# Patient Record
Sex: Female | Born: 1985 | Race: White | Hispanic: No | Marital: Single | Smoking: Never smoker
Health system: Southern US, Community
[De-identification: ages and names within clinical notes are randomized; demographics above are authoritative.]

## PROBLEM LIST (undated history)

## (undated) DIAGNOSIS — N289 Disorder of kidney and ureter, unspecified: Secondary | ICD-10-CM

---

## 2019-01-23 ENCOUNTER — Emergency Department (HOSPITAL_COMMUNITY)
Admission: EM | Admit: 2019-01-23 | Discharge: 2019-01-23 | Disposition: A | Discharge status: Home / Self Care | Attending: Emergency Medicine | Admitting: Emergency Medicine

## 2019-01-23 ENCOUNTER — Other Ambulatory Visit: Payer: Self-pay

## 2019-01-23 ENCOUNTER — Encounter (HOSPITAL_COMMUNITY): Payer: Self-pay | Admitting: *Deleted

## 2019-01-23 DIAGNOSIS — M549 Dorsalgia, unspecified: Secondary | ICD-10-CM | POA: Diagnosis not present

## 2019-01-23 DIAGNOSIS — R1031 Right lower quadrant pain: Secondary | ICD-10-CM | POA: Insufficient documentation

## 2019-01-23 DIAGNOSIS — Z5321 Procedure and treatment not carried out due to patient leaving prior to being seen by health care provider: Secondary | ICD-10-CM | POA: Insufficient documentation

## 2019-01-23 LAB — URINALYSIS, ROUTINE W REFLEX MICROSCOPIC
Bilirubin Urine: NEGATIVE
Glucose, UA: NEGATIVE mg/dL
Ketones, ur: NEGATIVE mg/dL
Leukocytes,Ua: NEGATIVE
Nitrite: NEGATIVE
Protein, ur: NEGATIVE mg/dL
Specific Gravity, Urine: 1.021 (ref 1.005–1.030)
pH: 5 (ref 5.0–8.0)

## 2019-01-23 LAB — CBC
HCT: 41.2 % (ref 36.0–46.0)
Hemoglobin: 13.5 g/dL (ref 12.0–15.0)
MCH: 29.9 pg (ref 26.0–34.0)
MCHC: 32.8 g/dL (ref 30.0–36.0)
MCV: 91.4 fL (ref 80.0–100.0)
Platelets: 249 10*3/uL (ref 150–400)
RBC: 4.51 MIL/uL (ref 3.87–5.11)
RDW: 12 % (ref 11.5–15.5)
WBC: 11.5 10*3/uL — ABNORMAL HIGH (ref 4.0–10.5)
nRBC: 0 % (ref 0.0–0.2)

## 2019-01-23 LAB — COMPREHENSIVE METABOLIC PANEL
ALT: 10 U/L (ref 0–44)
AST: 13 U/L — ABNORMAL LOW (ref 15–41)
Albumin: 3.8 g/dL (ref 3.5–5.0)
Alkaline Phosphatase: 64 U/L (ref 38–126)
Anion gap: 11 (ref 5–15)
BUN: 9 mg/dL (ref 6–20)
CO2: 23 mmol/L (ref 22–32)
Calcium: 9 mg/dL (ref 8.9–10.3)
Chloride: 106 mmol/L (ref 98–111)
Creatinine, Ser: 0.75 mg/dL (ref 0.44–1.00)
GFR calc Af Amer: >60 mL/min (ref >60)
GFR calc non Af Amer: >60 mL/min (ref >60)
Glucose, Bld: 103 mg/dL — ABNORMAL HIGH (ref 70–99)
Potassium: 3.9 mmol/L (ref 3.5–5.1)
Sodium: 140 mmol/L (ref 135–145)
Total Bilirubin: 0.4 mg/dL (ref 0.3–1.2)
Total Protein: 6.4 g/dL — ABNORMAL LOW (ref 6.5–8.1)

## 2019-01-23 LAB — I-STAT BETA HCG BLOOD, ED (MC, WL, AP ONLY): I-stat hCG, quantitative: <5 m[IU]/mL (ref <5)

## 2019-01-23 LAB — LIPASE, BLOOD: Lipase: 31 U/L (ref 11–51)

## 2019-01-23 MED ORDER — SODIUM CHLORIDE 0.9% FLUSH: 3.0000 mL | Freq: Once | INTRAVENOUS | Status: DC

## 2019-01-23 NOTE — ED Triage Notes (Signed)
Pt reports waking up this am with back pain and RLQ pain. RLQ was severe and decreased after vomiting. No problems urinating this am, treated one week ago for UTI.

## 2019-01-23 NOTE — ED Notes (Signed)
Pt leaving AMA. Pt stated she is going to look at [facility] for results. Pt advised to return if symptoms worsen.

## 2019-01-24 ENCOUNTER — Emergency Department (HOSPITAL_COMMUNITY)

## 2019-01-24 ENCOUNTER — Encounter (HOSPITAL_COMMUNITY): Payer: Self-pay

## 2019-01-24 ENCOUNTER — Other Ambulatory Visit: Payer: Self-pay

## 2019-01-24 ENCOUNTER — Emergency Department (HOSPITAL_COMMUNITY)
Admission: EM | Admit: 2019-01-24 | Discharge: 2019-01-24 | Disposition: A | Discharge status: Home / Self Care | Attending: Emergency Medicine | Admitting: Emergency Medicine

## 2019-01-24 DIAGNOSIS — Z79899 Other long term (current) drug therapy: Secondary | ICD-10-CM | POA: Diagnosis not present

## 2019-01-24 DIAGNOSIS — R1031 Right lower quadrant pain: Secondary | ICD-10-CM | POA: Diagnosis present

## 2019-01-24 DIAGNOSIS — N132 Hydronephrosis with renal and ureteral calculous obstruction: Secondary | ICD-10-CM | POA: Diagnosis not present

## 2019-01-24 HISTORY — DX: Disorder of kidney and ureter, unspecified: N28.9

## 2019-01-24 LAB — COMPREHENSIVE METABOLIC PANEL
ALT: 12 U/L (ref 0–44)
AST: 15 U/L (ref 15–41)
Albumin: 4.1 g/dL (ref 3.5–5.0)
Alkaline Phosphatase: 63 U/L (ref 38–126)
Anion gap: 11 (ref 5–15)
BUN: 12 mg/dL (ref 6–20)
CO2: 22 mmol/L (ref 22–32)
Calcium: 9.2 mg/dL (ref 8.9–10.3)
Chloride: 107 mmol/L (ref 98–111)
Creatinine, Ser: 0.88 mg/dL (ref 0.44–1.00)
GFR calc Af Amer: >60 mL/min (ref >60)
GFR calc non Af Amer: >60 mL/min (ref >60)
Glucose, Bld: 123 mg/dL — ABNORMAL HIGH (ref 70–99)
Potassium: 4.2 mmol/L (ref 3.5–5.1)
Sodium: 140 mmol/L (ref 135–145)
Total Bilirubin: 0.6 mg/dL (ref 0.3–1.2)
Total Protein: 7 g/dL (ref 6.5–8.1)

## 2019-01-24 LAB — CBC WITH DIFFERENTIAL/PLATELET
Abs Immature Granulocytes: 0.06 10*3/uL (ref 0.00–0.07)
Basophils Absolute: 0.1 10*3/uL (ref 0.0–0.1)
Basophils Relative: 0 %
Eosinophils Absolute: 0 10*3/uL (ref 0.0–0.5)
Eosinophils Relative: 0 %
HCT: 39.4 % (ref 36.0–46.0)
Hemoglobin: 13.1 g/dL (ref 12.0–15.0)
Immature Granulocytes: 0 %
Lymphocytes Relative: 4 %
Lymphs Abs: 0.7 10*3/uL (ref 0.7–4.0)
MCH: 30.1 pg (ref 26.0–34.0)
MCHC: 33.2 g/dL (ref 30.0–36.0)
MCV: 90.6 fL (ref 80.0–100.0)
Monocytes Absolute: 0.5 10*3/uL (ref 0.1–1.0)
Monocytes Relative: 3 %
Neutro Abs: 14.6 10*3/uL — ABNORMAL HIGH (ref 1.7–7.7)
Neutrophils Relative %: 93 %
Platelets: 215 10*3/uL (ref 150–400)
RBC: 4.35 MIL/uL (ref 3.87–5.11)
RDW: 12 % (ref 11.5–15.5)
WBC: 15.9 10*3/uL — ABNORMAL HIGH (ref 4.0–10.5)
nRBC: 0 % (ref 0.0–0.2)

## 2019-01-24 LAB — URINALYSIS, ROUTINE W REFLEX MICROSCOPIC
Bilirubin Urine: NEGATIVE
Glucose, UA: NEGATIVE mg/dL
Ketones, ur: 20 mg/dL — AB
Leukocytes,Ua: NEGATIVE
Nitrite: NEGATIVE
Protein, ur: 30 mg/dL — AB
Specific Gravity, Urine: 1.01 (ref 1.005–1.030)
pH: 6 (ref 5.0–8.0)

## 2019-01-24 LAB — PREGNANCY, URINE: Preg Test, Ur: NEGATIVE

## 2019-01-24 MED ORDER — ONDANSETRON 4 MG PO TBDP: 4.0000 mg | ORAL_TABLET | Freq: Three times a day (TID) | ORAL | 0 refills | Status: AC | PRN

## 2019-01-24 MED ORDER — ONDANSETRON 4 MG PO TBDP: 4.0000 mg | ORAL_TABLET | Freq: Three times a day (TID) | ORAL | 0 refills | Status: DC | PRN

## 2019-01-24 MED ORDER — NAPROXEN 500 MG PO TABS: 500.0000 mg | ORAL_TABLET | Freq: Two times a day (BID) | ORAL | 0 refills | Status: DC

## 2019-01-24 MED ORDER — NAPROXEN 500 MG PO TABS: 500.0000 mg | ORAL_TABLET | Freq: Two times a day (BID) | ORAL | 0 refills | Status: AC

## 2019-01-24 MED ORDER — SODIUM CHLORIDE 0.9 % IV BOLUS
500.0000 mL | Freq: Once | INTRAVENOUS | Status: AC
  Administered 2019-01-24: 500 mL via INTRAVENOUS

## 2019-01-24 NOTE — ED Provider Notes (Signed)
[facility]-EMERGENCY DEPT Provider Note   CSN: [id number] Arrival date & time: 01/24/19  0741     History Chief Complaint  Patient presents with  . Abdominal Pain  . Emesis    Paula Jackson is a 34 y.o. female with no significant PMH presents to the ED via EMS for 10 out of 10 acute onset right-sided flank and RLQ pain with associated nausea and vomiting.  Patient reports that she was treated for a UTI at a [facility] 01/10/2019 and her symptoms have entirely resolved before she began having this right-sided flank and RLQ abdominal pain that suddenly began 2 days ago.  She presented to the ED yesterday and obtained testing which demonstrated hematuria, but due to the excessive wait time she LWBS.  Her pain had reportedly subsided and she was feeling better, but then returned with a vengeance this morning.  She also endorses increased urinary frequency and urgency, but denies any dysuria and states that this feels much different from her recent UTI.  She also endorses diminished appetite and frequent nausea and vomiting, now dry heaving.  She denies any fevers or chills, recent illness, hematemesis, gross hematuria, chest pain or shortness of breath, vaginal discharge, vaginal pain, headache or dizziness, or other symptoms.  She received fentanyl and Zofran in route via EMS and currently her pain is relatively well controlled.  Patient lives approximately 2 hours away, but is in town visiting her mother.  Her mother or her brother will be available to pick her up after today's encounter.   HPI     Past Medical History:  Diagnosis Date  . Renal disorder     There are no problems to display for this patient.   History reviewed. No pertinent surgical history.   OB History   No obstetric history on file.     Family History  Problem Relation Age of Onset  . Thyroid disease Mother     Social History   Tobacco Use  . Smoking status: Never Smoker    . Smokeless tobacco: Never Used  Substance Use Topics  . Alcohol use: Never  . Drug use: Never    Home Medications Prior to Admission medications   Medication Sig Start Date End Date Taking? Authorizing Provider  doxycycline (VIBRAMYCIN) 100 MG capsule Take 100 mg by mouth daily. 11/30/17  Yes [provider]  naproxen (NAPROSYN) 500 MG tablet Take 1 tablet (500 mg total) by mouth 2 (two) times daily. 01/24/19   Lorelee New, PA-C  ondansetron (ZOFRAN ODT) 4 MG disintegrating tablet Take 1 tablet (4 mg total) by mouth every 8 (eight) hours as needed for nausea or vomiting. 01/24/19   Lorelee New, PA-C    Allergies    Patient has no known allergies.  Review of Systems   Review of Systems  All other systems reviewed and are negative.   Physical Exam Updated Vital Signs BP 116/69   Pulse 76   Temp 97.6 F (36.4 C) (Oral)   Resp 16   Ht 5\' 2"  (1.575 m)   Wt 70.3 kg   LMP 12/29/2018   SpO2 98%   BMI 28.35 kg/m   Physical Exam Vitals and nursing note reviewed. Exam conducted with a chaperone present.  Constitutional:      Comments: Uncomfortable.  HENT:     Head: Normocephalic and atraumatic.  Eyes:     General: No scleral icterus.    Conjunctiva/sclera: Conjunctivae normal.  Cardiovascular:  Rate and Rhythm: Normal rate and regular rhythm.     Pulses: Normal pulses.     Heart sounds: Normal heart sounds.  Pulmonary:     Effort: Pulmonary effort is normal. No respiratory distress.     Breath sounds: Normal breath sounds. No wheezing or rales.  Abdominal:     Comments: Soft, nondistended.  TTP in RLQ, no TTP elsewhere.  No overlying skin changes.  No guarding.  No significant right CVA tenderness.  Negative Rovsing and psoas sign  Musculoskeletal:     Cervical back: Normal range of motion and neck supple. No rigidity.  Skin:    General: Skin is dry.  Neurological:     Mental Status: She is alert.     GCS: GCS eye subscore is 4. GCS verbal  subscore is 5. GCS motor subscore is 6.  Psychiatric:        Mood and Affect: Mood normal.        Behavior: Behavior normal.        Thought Content: Thought content normal.     ED Results / Procedures / Treatments   Labs (all labs ordered are listed, but only abnormal results are displayed) Labs Reviewed  URINALYSIS, ROUTINE W REFLEX MICROSCOPIC - Abnormal; Notable for the following components:      Result Value   Hgb urine dipstick LARGE (*)    Ketones, ur 20 (*)    Protein, ur 30 (*)    Bacteria, UA RARE (*)    All other components within normal limits  CBC WITH DIFFERENTIAL/PLATELET - Abnormal; Notable for the following components:   WBC 15.9 (*)    Neutro Abs 14.6 (*)    All other components within normal limits  COMPREHENSIVE METABOLIC PANEL - Abnormal; Notable for the following components:   Glucose, Bld 123 (*)    All other components within normal limits  PREGNANCY, URINE    EKG None  Radiology CT Renal Stone Study  Result Date: 01/24/2019 CLINICAL DATA:  Hematuria, flank pain. EXAM: CT ABDOMEN AND PELVIS WITHOUT CONTRAST TECHNIQUE: Multidetector CT imaging of the abdomen and pelvis was performed following the standard protocol without IV contrast. COMPARISON:  None. FINDINGS: Lower chest: Lung bases are clear. Heart size normal. No pericardial or pleural effusion. Hepatobiliary: Liver and gallbladder are unremarkable. No biliary ductal dilatation. Pancreas: Negative. Spleen: Negative. Adrenals/Urinary Tract: Adrenal glands are unremarkable. There is mild right renal edema and mild right hydronephrosis. No obstructing urinary calculi. Left kidney is unremarkable. Left ureter is decompressed. Bladder is low in volume. Stomach/Bowel: Stomach, small bowel, appendix and colon are unremarkable. Vascular/Lymphatic: Vascular structures are unremarkable. No pathologically enlarged lymph nodes. Reproductive: Uterus is visualized. No adnexal mass. Menstrual cup is seen in the vaginal  canal. Other: No free fluid.  Mesenteries and peritoneum are unremarkable. Musculoskeletal: Negative. IMPRESSION: 1. Right renal edema and mild right hydronephrosis. No urinary calculi. Findings can be seen with recent passage of a stone. Concomitant pyelonephritis is not excluded. 2. No evidence of appendicitis. Electronically Signed   By: Lorin Picket M.D.   On: 01/24/2019 13:17    Procedures Procedures (including critical care time)  Medications Ordered in ED Medications  sodium chloride 0.9 % bolus 500 mL (500 mLs Intravenous Bolus from Bag 01/24/19 1127)    ED Course  I have reviewed the triage vital signs and the nursing notes.  Pertinent labs & imaging results that were available during my care of the patient were reviewed by me and considered in my  medical decision making (see chart for details).    MDM Rules/Calculators/A&P                      Patient describes her sudden onset of right-sided pain is beginning in her right flank and radiating into her RLQ with associated diminished appetite, nausea, and vomiting.  She reports that her UTI symptoms had resolved, but she no longer endorses dysuria, malodorous urine, or fevers and I have lower suspicion for pyelonephritis at this time.  She reports that culture was obtained which was positive for E. coli, susceptible to her prescribed nitrofurantoin.  Given description of her pain, lower suspicion for ovarian torsion at this time.  Instead, her history is more suspicious for obstructing ureterolithiasis versus appendicitis.   No prior history of abdominal surgeries.   Will obtain basic labs as well as CT renal stone study.   CBC with differential demonstrates leukocytosis to 15.9 with left shift, elevated from 11.5 yesterday.  CMP demonstrates no electrolyte abnormalities or impaired renal function.  CT renal stone study demonstrates a right renal edema and mild right hydronephrosis possibly suggestive of recent passage of a stone.  No  urinary calculi visualized on imaging.  No evidence to suggest appendicitis.  While imaging cannot exclude concomitant pyelonephritis.  Repeat physical exam again demonstrated no CVA tenderness.  Patient is in no acute distress and has not been complaining of any pain or nausea symptoms since she arrived to the room.  UA demonstrates no evidence of infection.  Will discharge patient with short course of Zofran, naproxen, as well as strict return precautions for any new or worsening symptoms.   Final Clinical Impression(s) / ED Diagnoses Final diagnoses:  Hydronephrosis concurrent with and due to calculi of kidney and ureter    Rx / DC Orders ED Discharge Orders         Ordered    ondansetron (ZOFRAN ODT) 4 MG disintegrating tablet  Every 8 hours PRN,   Status:  Discontinued     01/24/19 1346    naproxen (NAPROSYN) 500 MG tablet  2 times daily,   Status:  Discontinued     01/24/19 1346    naproxen (NAPROSYN) 500 MG tablet  2 times daily     01/24/19 1346    ondansetron (ZOFRAN ODT) 4 MG disintegrating tablet  Every 8 hours PRN     01/24/19 1346           Elvera Maria 01/24/19 1347    Raeford Razor, MD 01/24/19 1445

## 2019-01-24 NOTE — Discharge Instructions (Addendum)
CT imaging obtained today in conjunction with reported history is consistent with an obstructing kidney stone.  Fortunately, it appears to have passed and should no longer be an issue.  Please read the attachments on kidney stones and suggested dietary modifications.  I prescribed you a short course of Zofran and naproxen should you develop any residual spasms or discomfort from your persistent mild right-sided hydronephrosis.  Again, no need to provide a strainer as the suspected stone appears to have passed.  Please return to the ED or seek medical attention for any new or worsening symptoms.

## 2019-01-24 NOTE — ED Triage Notes (Signed)
Per EMS- Patient reports that she was seen yesterday and was diagnosed with kidney stones and hematuria. Patient having pain again today. Patient rated pain 9/10. EMS gave Fentanyl 150 mcg and Zofran 4mg  IV prior to arrival to the ED.

## 2020-12-26 IMAGING — CT CT RENAL STONE PROTOCOL
2 of 4 series · 17 of 46 positions shown, 19 images · non-contrast
Comparison: None.

CLINICAL DATA: Hematuria, flank pain.

EXAM:
CT ABDOMEN AND PELVIS WITHOUT CONTRAST
TECHNIQUE: Multidetector CT imaging of the abdomen and pelvis was performed
following the standard protocol without IV contrast.

[Series 3: coronal · coronal · 0.67mm/px · 3 of 136 slices shown]
[im 46/136  soft-tissue]
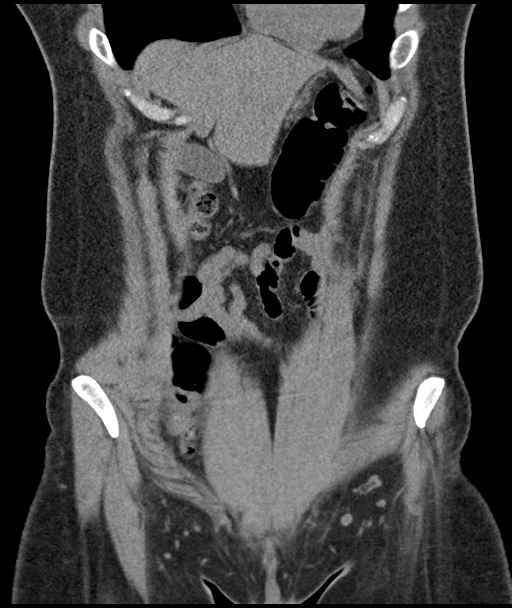
[im 61/136  soft-tissue]
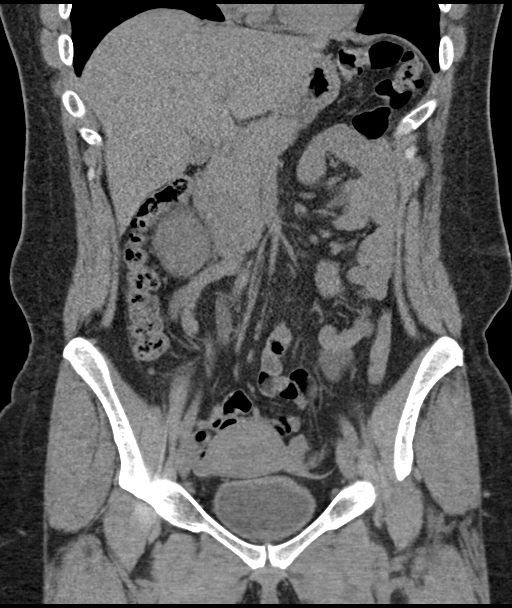
[im 76/136  soft-tissue]
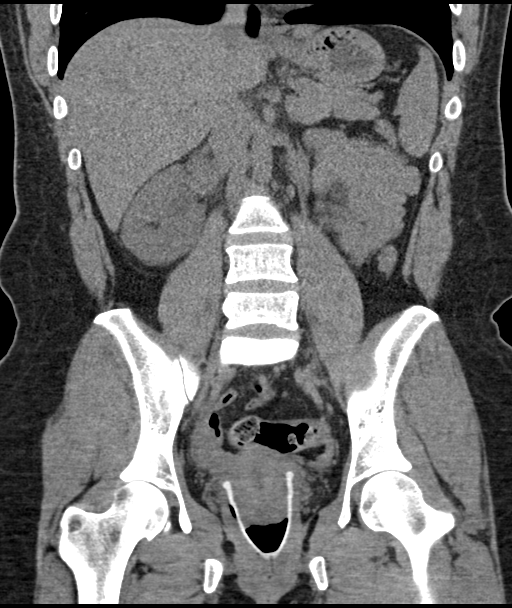

[Series 6: axial st · axial · 0.79mm/px · z∈[-381,-31]mm · 14 of 80 slices shown, 16 images]
[im 5/80  soft-tissue]
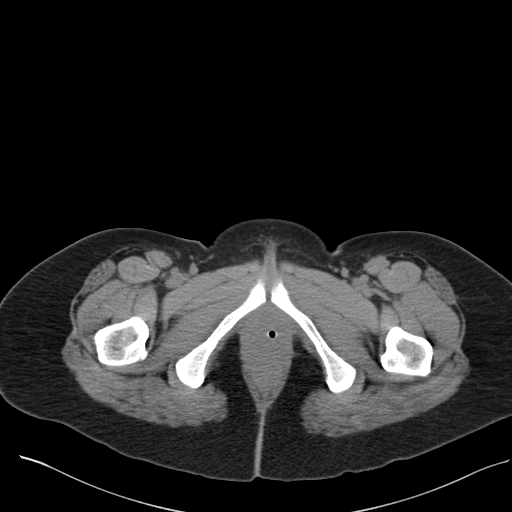
[im 5/80  bone]
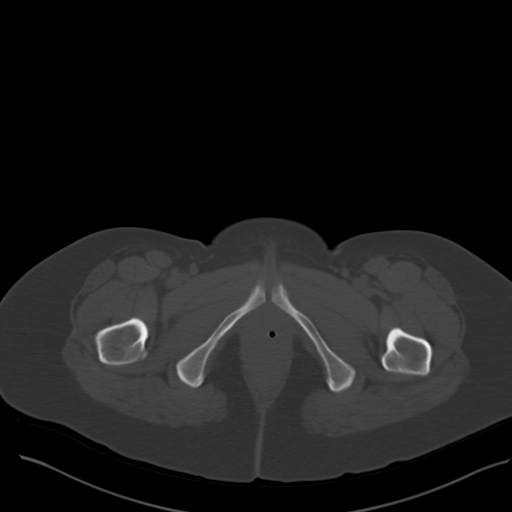
[im 9/80  soft-tissue]
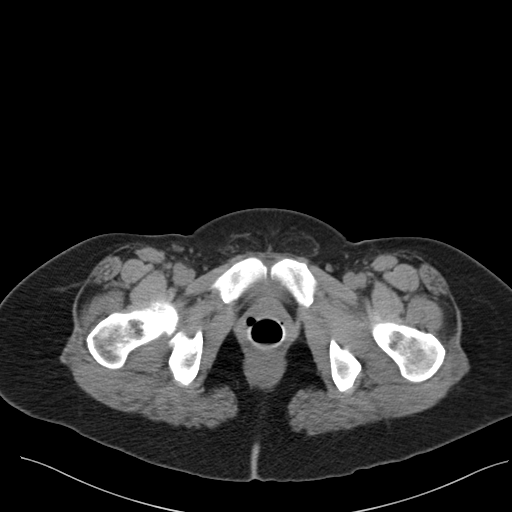
[im 17/80  soft-tissue]
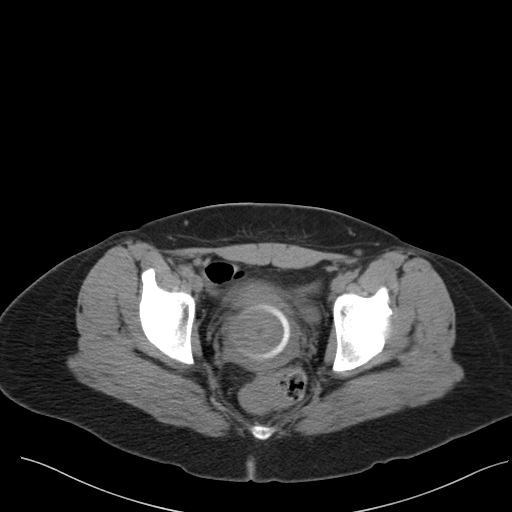
[im 21/80  soft-tissue]
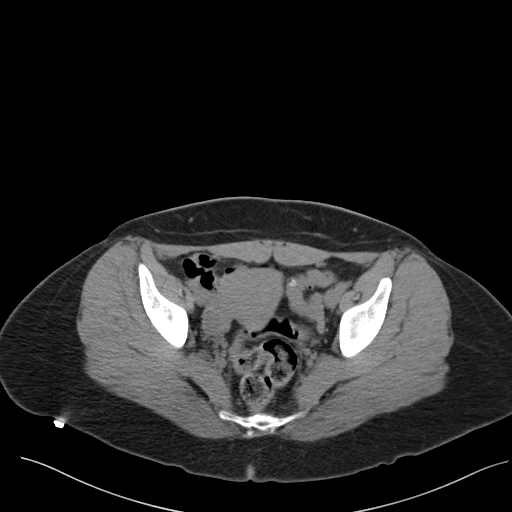
[im 25/80  soft-tissue]
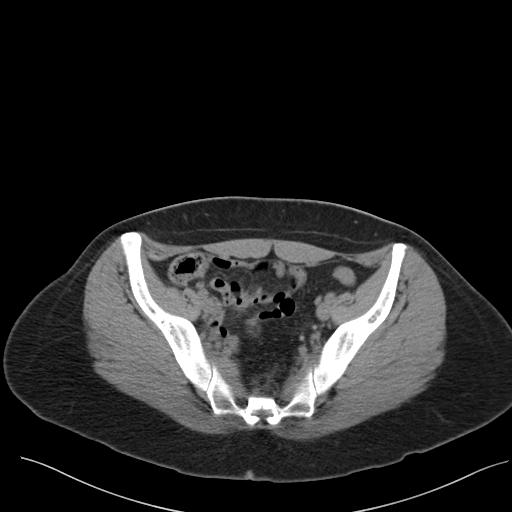
[im 34/80  soft-tissue]
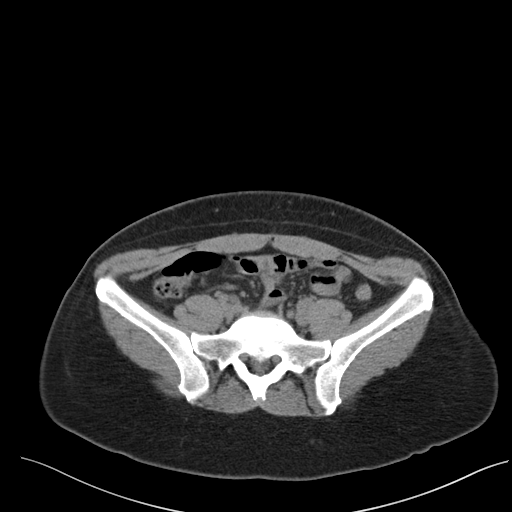
[im 38/80  soft-tissue]
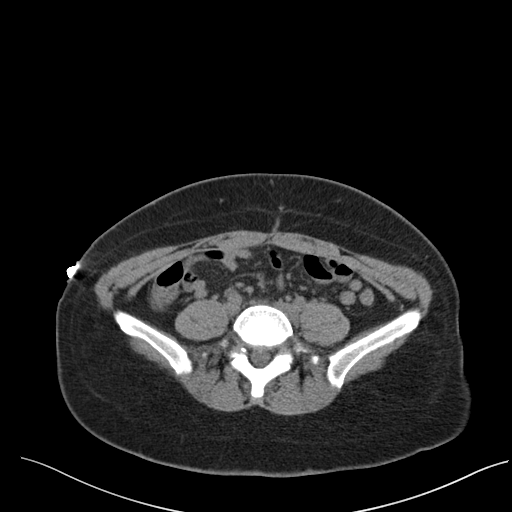
[im 42/80  soft-tissue]
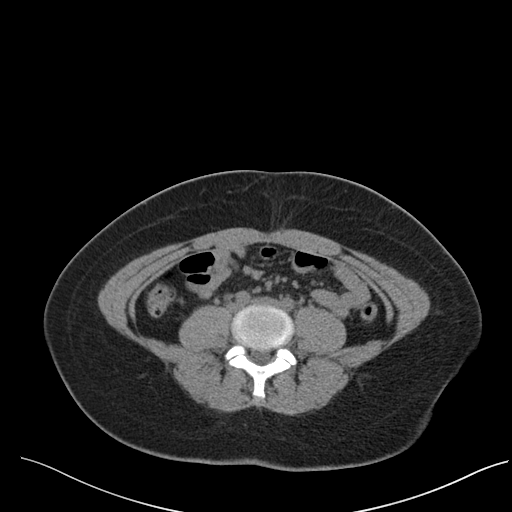
[im 46/80  soft-tissue]
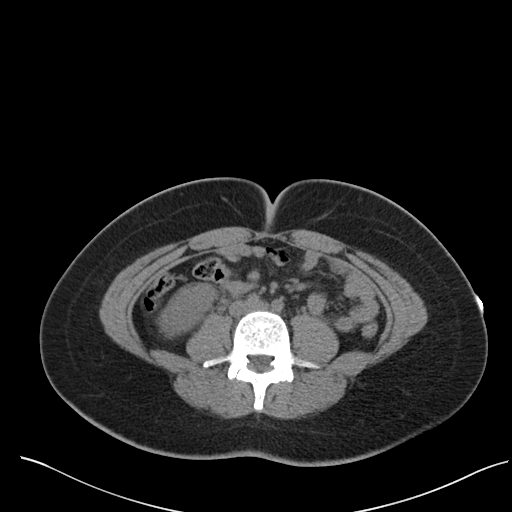
[im 46/80  bone]
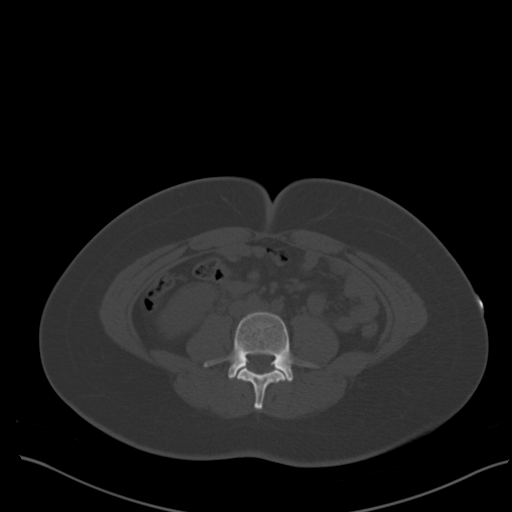
[im 55/80  soft-tissue]
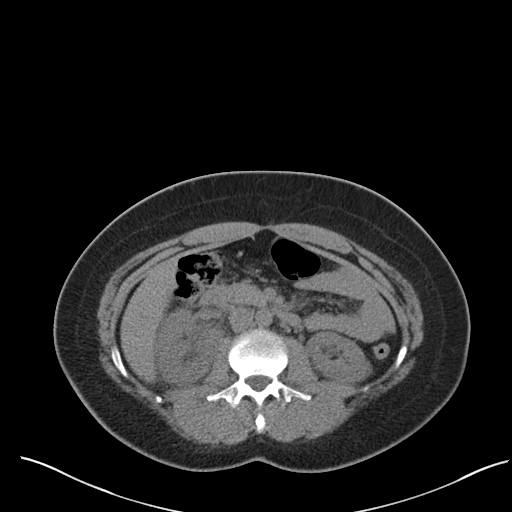
[im 59/80  soft-tissue]
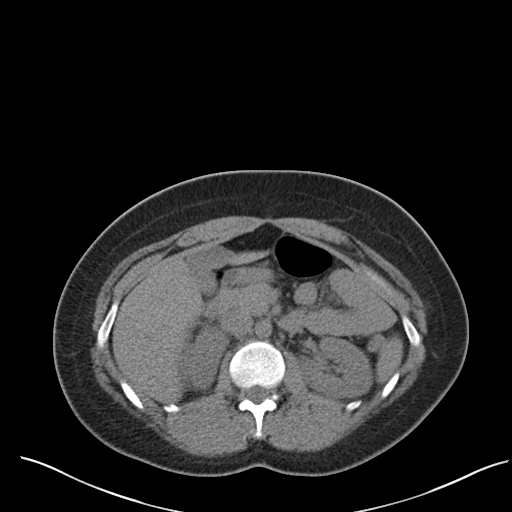
[im 63/80  soft-tissue]
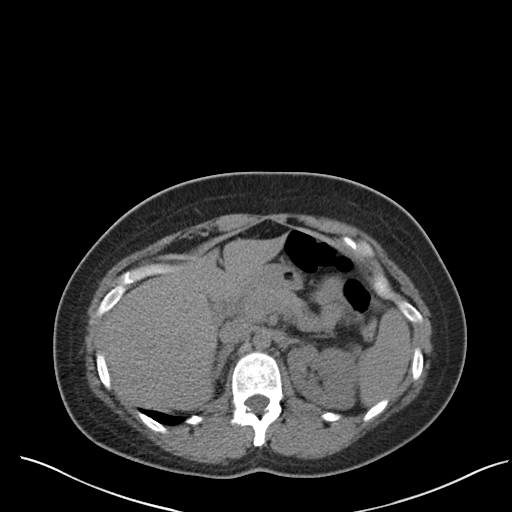
[im 71/80  soft-tissue]
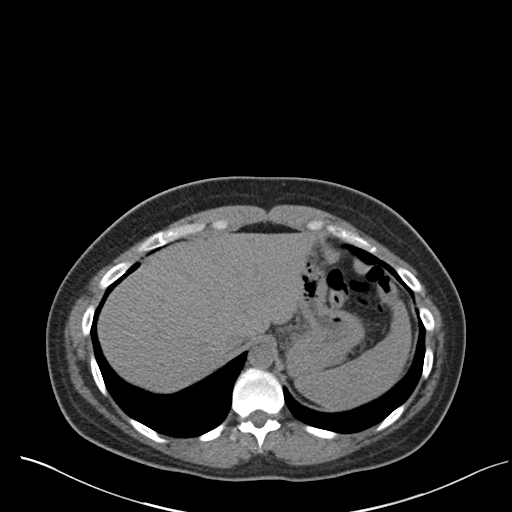
[im 75/80  soft-tissue]
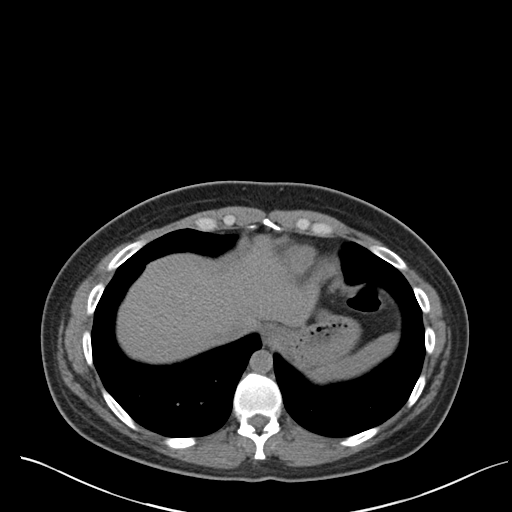

[17 of 46 positions shown; findings below may reference images not displayed]

FINDINGS: Lower chest: Lung bases are clear. Heart size normal. No pericardial
or pleural effusion.

Hepatobiliary: Liver and gallbladder are unremarkable. No biliary
ductal dilatation.

Pancreas: Negative.

Spleen: Negative.

Adrenals/Urinary Tract: Adrenal glands are unremarkable. There is
mild right renal edema and mild right hydronephrosis. No obstructing
urinary calculi. Left kidney is unremarkable. Left ureter is
decompressed. Bladder is low in volume.

Stomach/Bowel: Stomach, small bowel, appendix and colon are
unremarkable.

Vascular/Lymphatic: Vascular structures are unremarkable. No
pathologically enlarged lymph nodes.

Reproductive: Uterus is visualized. No adnexal mass. Menstrual cup
is seen in the vaginal canal.

Other: No free fluid.  Mesenteries and peritoneum are unremarkable.

Musculoskeletal: Negative.
IMPRESSION: 1. Right renal edema and mild right hydronephrosis. No urinary
calculi. Findings can be seen with recent passage of a stone.
Concomitant pyelonephritis is not excluded.
2. No evidence of appendicitis.
